# Patient Record
Sex: Female | Born: 1945 | Race: White | Hispanic: No | Marital: Married | Smoking: Never smoker
Health system: Southern US, Community
[De-identification: ages and names within clinical notes are randomized; demographics above are authoritative.]

## PROBLEM LIST (undated history)

## (undated) DIAGNOSIS — Q615 Medullary cystic kidney: Secondary | ICD-10-CM

## (undated) DIAGNOSIS — I341 Nonrheumatic mitral (valve) prolapse: Secondary | ICD-10-CM

## (undated) HISTORY — PX: TONSILLECTOMY: SUR1361

## (undated) HISTORY — PX: OTHER SURGICAL HISTORY: SHX169

---

## 2004-07-08 ENCOUNTER — Ambulatory Visit: Payer: Self-pay | Admitting: Gastroenterology

## 2004-07-29 ENCOUNTER — Ambulatory Visit: Payer: Self-pay | Admitting: Gastroenterology

## 2008-02-16 ENCOUNTER — Emergency Department (HOSPITAL_COMMUNITY): Admission: EM | Admit: 2008-02-16 | Discharge: 2008-02-16 | Payer: Self-pay | Admitting: Emergency Medicine

## 2010-05-31 LAB — URINALYSIS, ROUTINE W REFLEX MICROSCOPIC
Bilirubin Urine: NEGATIVE
Ketones, ur: 15 mg/dL — AB
Specific Gravity, Urine: 1.03 (ref 1.005–1.030)
pH: 6.5 (ref 5.0–8.0)

## 2010-05-31 LAB — BASIC METABOLIC PANEL
Calcium: 8.9 mg/dL (ref 8.4–10.5)
Creatinine, Ser: 0.68 mg/dL (ref 0.4–1.2)
GFR calc Af Amer: >60 mL/min (ref >60)
GFR calc non Af Amer: >60 mL/min (ref >60)
Sodium: 139 mEq/L (ref 135–145)

## 2010-05-31 LAB — URINE MICROSCOPIC-ADD ON

## 2010-05-31 LAB — POCT I-STAT, CHEM 8
Calcium, Ion: 1.12 mmol/L (ref 1.12–1.32)
Chloride: 103 mEq/L (ref 96–112)
HCT: 43 % (ref 36.0–46.0)
Potassium: 4 mEq/L (ref 3.5–5.1)
Sodium: 140 mEq/L (ref 135–145)

## 2010-05-31 LAB — HEPATIC FUNCTION PANEL
Albumin: 3.6 g/dL (ref 3.5–5.2)
Total Protein: 7 g/dL (ref 6.0–8.3)

## 2010-05-31 LAB — MAGNESIUM: Magnesium: 2.2 mg/dL (ref 1.5–2.5)

## 2010-05-31 LAB — LIPASE, BLOOD: Lipase: 25 U/L (ref 11–59)

## 2011-05-06 ENCOUNTER — Other Ambulatory Visit: Payer: Self-pay | Admitting: Family Medicine

## 2011-05-14 ENCOUNTER — Other Ambulatory Visit: Payer: Self-pay | Admitting: Family Medicine

## 2011-06-08 ENCOUNTER — Other Ambulatory Visit: Payer: Self-pay | Admitting: Family Medicine

## 2011-07-19 ENCOUNTER — Ambulatory Visit (INDEPENDENT_AMBULATORY_CARE_PROVIDER_SITE_OTHER): Payer: BC Managed Care – PPO | Admitting: Family Medicine

## 2011-07-19 ENCOUNTER — Other Ambulatory Visit: Payer: Self-pay | Admitting: Family Medicine

## 2011-07-19 VITALS — BP 131/79 | HR 71 | Temp 98.4°F | Resp 18 | Ht 62.5 in | Wt 139.0 lb

## 2011-07-19 DIAGNOSIS — E039 Hypothyroidism, unspecified: Secondary | ICD-10-CM

## 2011-07-19 DIAGNOSIS — K219 Gastro-esophageal reflux disease without esophagitis: Secondary | ICD-10-CM

## 2011-07-19 DIAGNOSIS — Z780 Asymptomatic menopausal state: Secondary | ICD-10-CM

## 2011-07-19 MED ORDER — CONJ ESTROG-MEDROXYPROGEST ACE 0.3-1.5 MG PO TABS: 1.0000 | ORAL_TABLET | Freq: Every day | ORAL | Status: DC

## 2011-07-19 MED ORDER — LEVOTHYROXINE SODIUM 75 MCG PO TABS: 75.0000 ug | ORAL_TABLET | Freq: Every day | ORAL | Status: DC

## 2011-07-19 MED ORDER — OMEPRAZOLE 20 MG PO CPDR: 20.0000 mg | DELAYED_RELEASE_CAPSULE | Freq: Every day | ORAL | Status: DC

## 2011-07-19 NOTE — Progress Notes (Signed)
  Subjective:    Patient ID: Karen Valenzuela, female    DOB: October 03, 1945, 66 y.o.   MRN: [id number]  HPI  Patient presents for routine follow up.  Here for medication refills Patient states her endocrinologist retired(Dr. Dagoberto Ligas) however told her to remain on Synthroid and Prempro.  States last pap normal 2011 Dr. Seymour Bars Mammogram many years ago  Review of Systems  Constitutional: Positive for fatigue.  Genitourinary:       Reduced libido       Objective:   Physical Exam  Constitutional: She appears well-developed.  Neck: Neck supple. No thyromegaly present.  Cardiovascular: Normal rate, regular rhythm and normal heart sounds.   Pulmonary/Chest: Effort normal and breath sounds normal.  Abdominal: Soft. Bowel sounds are normal.       No HSM  Neurological: She is alert.  Skin: Skin is warm.       Thyroidectomy scar       Assessment & Plan:   1. Hypothyroid   2. GERD (gastroesophageal reflux disease)   3. Postmenopausal     TSH Refill Synthroid X year Prempro X 3 months only; after considerable discussion patient agrees to mammogram for early detection of breast cancer  along with the added risk associated with hormonal replacement therapy.  If mammogram normal OK to refill Prempro for  remainder of year.  Discussed testosterone cream to assist with libido however patient declined Patient wanted to have a CPE as part of her urgent care visit and I encouraged her to schedule her CPE at 104.  She agreed to call back for appointment.

## 2011-07-20 LAB — TSH: TSH: 0.019 u[IU]/mL — ABNORMAL LOW (ref 0.350–4.500)

## 2011-07-21 LAB — T4, FREE: Free T4: 1.51 ng/dL (ref 0.80–1.80)

## 2011-07-21 LAB — T3, FREE: T3, Free: 2.9 pg/mL (ref 2.3–4.2)

## 2011-10-04 ENCOUNTER — Encounter: Payer: Self-pay | Admitting: Physician Assistant

## 2011-10-04 DIAGNOSIS — Z9289 Personal history of other medical treatment: Secondary | ICD-10-CM | POA: Insufficient documentation

## 2011-10-31 ENCOUNTER — Other Ambulatory Visit: Payer: Self-pay | Admitting: Family Medicine

## 2011-10-31 NOTE — Telephone Encounter (Signed)
See Mammo note-- ok to refill x 9 months??

## 2012-04-18 ENCOUNTER — Other Ambulatory Visit: Payer: Self-pay | Admitting: Radiology

## 2012-07-13 ENCOUNTER — Encounter: Payer: Self-pay | Admitting: Gastroenterology

## 2013-10-23 ENCOUNTER — Encounter (HOSPITAL_COMMUNITY): Payer: Self-pay

## 2013-10-23 ENCOUNTER — Other Ambulatory Visit: Payer: Self-pay | Admitting: Family Medicine

## 2013-10-23 ENCOUNTER — Ambulatory Visit (INDEPENDENT_AMBULATORY_CARE_PROVIDER_SITE_OTHER): Payer: Medicare Other | Admitting: Family Medicine

## 2013-10-23 ENCOUNTER — Ambulatory Visit (HOSPITAL_COMMUNITY)
Admission: RE | Admit: 2013-10-23 | Discharge: 2013-10-23 | Disposition: A | Discharge status: Home / Self Care | Payer: Medicare Other | Source: Ambulatory Visit | Attending: Family Medicine | Admitting: Family Medicine

## 2013-10-23 VITALS — BP 132/90 | HR 86 | Temp 97.9°F | Resp 16 | Ht 62.25 in | Wt 144.0 lb

## 2013-10-23 DIAGNOSIS — R1032 Left lower quadrant pain: Secondary | ICD-10-CM | POA: Diagnosis present

## 2013-10-23 DIAGNOSIS — K7689 Other specified diseases of liver: Secondary | ICD-10-CM | POA: Diagnosis not present

## 2013-10-23 DIAGNOSIS — R935 Abnormal findings on diagnostic imaging of other abdominal regions, including retroperitoneum: Secondary | ICD-10-CM | POA: Diagnosis not present

## 2013-10-23 DIAGNOSIS — K4090 Unilateral inguinal hernia, without obstruction or gangrene, not specified as recurrent: Secondary | ICD-10-CM | POA: Diagnosis not present

## 2013-10-23 DIAGNOSIS — R109 Unspecified abdominal pain: Secondary | ICD-10-CM

## 2013-10-23 LAB — POCT UA - MICROSCOPIC ONLY
BACTERIA, U MICROSCOPIC: NEGATIVE
CRYSTALS, UR, HPF, POC: NEGATIVE
Casts, Ur, LPF, POC: NEGATIVE
Mucus, UA: NEGATIVE
Yeast, UA: NEGATIVE

## 2013-10-23 LAB — POCT CBC
Granulocyte percent: 60.3 %G (ref 37–80)
HCT, POC: 42.4 % (ref 37.7–47.9)
HEMOGLOBIN: 13.5 g/dL (ref 12.2–16.2)
LYMPH, POC: 2.7 (ref 0.6–3.4)
MCH: 29.8 pg (ref 27–31.2)
MCHC: 32 g/dL (ref 31.8–35.4)
MCV: 93.2 fL (ref 80–97)
MID (CBC): 0.6 (ref 0–0.9)
MPV: 6.9 fL (ref 0–99.8)
PLATELET COUNT, POC: 376 10*3/uL (ref 142–424)
POC GRANULOCYTE: 5 (ref 2–6.9)
POC LYMPH PERCENT: 32.3 %L (ref 10–50)
POC MID %: 7.4 % (ref 0–12)
RBC: 4.55 M/uL (ref 4.04–5.48)
RDW, POC: 14.5 %
WBC: 8.3 10*3/uL (ref 4.6–10.2)

## 2013-10-23 LAB — POCT URINALYSIS DIPSTICK
BILIRUBIN UA: NEGATIVE
GLUCOSE UA: NEGATIVE
Ketones, UA: NEGATIVE
Leukocytes, UA: NEGATIVE
NITRITE UA: NEGATIVE
Protein, UA: NEGATIVE
SPEC GRAV UA: 1.015
UROBILINOGEN UA: 0.2
pH, UA: 5.5

## 2013-10-23 LAB — BUN: BUN: 21 mg/dL (ref 6–23)

## 2013-10-23 LAB — CREATININE, SERUM: Creat: 0.89 mg/dL (ref 0.50–1.10)

## 2013-10-23 LAB — POCT I-STAT CREATININE: Creatinine, Ser: 0.9 mg/dL (ref 0.50–1.10)

## 2013-10-23 MED ORDER — IOHEXOL 300 MG/ML  SOLN
100.0000 mL | Freq: Once | INTRAMUSCULAR | Status: AC | PRN
  Administered 2013-10-23: 100 mL via INTRAVENOUS

## 2013-10-23 MED ORDER — IOHEXOL 300 MG/ML  SOLN
50.0000 mL | Freq: Once | INTRAMUSCULAR | Status: AC | PRN
  Administered 2013-10-23: 50 mL via ORAL

## 2013-10-23 NOTE — Progress Notes (Signed)
[facility] [address] [place] [address] [phone]  Date:  10/23/2013   Name:  Karen Valenzuela   DOB:  1945-06-29   MRN:  [id number]  PCP:  No PCP Per Patient    Chief Complaint: Groin Pain   History of Present Illness:  [name] is a 68 y.o. very pleasant female patient who presents with the following:  She is here today with left side sided groin pain.  She is known to have a left sided hernia- it has been there since her teenage years. Never had surgical repair, it has never bothered her in the past.  However about a week ago she started to have left groin pain.  NKI.   The area is is now painful if she is on her feet, walking, etc.   It hurts to press on the area.  She feels better if she lies down and rests.   She is eating normally and feels well generally, no vomiting or diarrehea.  No fever.   She has not noted any urinary sx  She is otherwise generally healthy.  Here today with her husband Tom  Patient Active Problem List   Diagnosis Date Noted  . H/O mammogram 10/04/2011    History reviewed. No pertinent past medical history.  History reviewed. No pertinent past surgical history.  History  Substance Use Topics  . Smoking status: Never Smoker   . Smokeless tobacco: Not on file  . Alcohol Use: Not on file    History reviewed. No pertinent family history.  No Known Allergies  Medication list has been reviewed and updated.  Current Outpatient Prescriptions on File Prior to Visit  Medication Sig Dispense Refill  . levothyroxine (SYNTHROID) 75 MCG tablet Take 1 tablet (75 mcg total) by mouth daily.  90 tablet  2  . omeprazole (PRILOSEC) 20 MG capsule Take 1 capsule (20 mg total) by mouth daily.  90 capsule  2  . PREMPRO 0.3-1.5 MG per tablet TAKE 1 TABLET BY MOUTH DAILY.  84 each  0   No current facility-administered medications on file prior to visit.    Review of Systems:  As per HPI- otherwise negative.   Physical  Examination: Filed Vitals:   10/23/13 1455  BP: 132/90  Pulse: 86  Temp: 97.9 F (36.6 C)  Resp: 16   Filed Vitals:   10/23/13 1455  Height: 5' 2.25" (1.581 m)  Weight: 144 lb (65.318 kg)   Body mass index is 26.13 kg/(m^2). Ideal Body Weight: Weight in (lb) to have BMI = 25: 137.5  GEN: WDWN, NAD, Non-toxic, A & O x 3, looks well HEENT: Atraumatic, Normocephalic. Neck supple. No masses, No LAD. Ears and Nose: No external deformity. CV: RRR, No M/G/R. No JVD. No thrill. No extra heart sounds. PULM: CTA B, no wheezes, crackles, rhonchi. No retractions. No resp. distress. No accessory muscle use. ABD: S, NT, ND, +BS. No rebound. No HSM. EXTR: No c/c/e NEURO Normal gait.  PSYCH: Normally interactive. Conversant. Not depressed or anxious appearing.  Calm demeanor.  She has a small visible bulge in the left inguinal region.  Not able to reduce by pressing on the area, but it also does not get larger with valsalva.    Results for orders placed in visit on 10/23/13  POCT URINALYSIS DIPSTICK      Result Value Ref Range   Color, UA yellow     Clarity, UA clear  Glucose, UA neg     Bilirubin, UA neg     Ketones, UA neg     Spec Grav, UA 1.015     Blood, UA small     pH, UA 5.5     Protein, UA neg     Urobilinogen, UA 0.2     Nitrite, UA neg     Leukocytes, UA Negative    POCT UA - MICROSCOPIC ONLY      Result Value Ref Range   WBC, Ur, HPF, POC 1-4     RBC, urine, microscopic 0-1     Bacteria, U Microscopic neg     Mucus, UA neg     Epithelial cells, urine per micros 1-2     Crystals, Ur, HPF, POC neg     Casts, Ur, LPF, POC neg     Yeast, UA neg    POCT CBC      Result Value Ref Range   WBC 8.3  4.6 - 10.2 K/uL   Lymph, poc 2.7  0.6 - 3.4   POC LYMPH PERCENT 32.3  10 - 50 %L   MID (cbc) 0.6  0 - 0.9   POC MID % 7.4  0 - 12 %M   POC Granulocyte 5.0  2 - 6.9   Granulocyte percent 60.3  37 - 80 %G   RBC 4.55  4.04 - 5.48 M/uL   Hemoglobin 13.5  12.2 - 16.2 g/dL    HCT, POC 40.9  81.1 - 47.9 %   MCV 93.2  80 - 97 fL   MCH, POC 29.8  27 - 31.2 pg   MCHC 32.0  31.8 - 35.4 g/dL   RDW, POC 91.4     Platelet Count, POC 376  142 - 424 K/uL   MPV 6.9  0 - 99.8 fL    Assessment and Plan: Left groin pain - Plan: CT Abdomen Pelvis W Contrast, POCT urinalysis dipstick, POCT UA - Microscopic Only, POCT CBC, Urine culture, CANCELED: CT Abdomen Pelvis W Contrast  Inguinal hernia, symptotic for one week.  Will refer for a CT today to determine the extent of her hernia and rule- out bowel strangulation.    Signed Abbe Amsterdam, MD  CT ABDOMEN AND PELVIS WITH CONTRAST  TECHNIQUE: Multidetector CT imaging of the abdomen and pelvis was performed using the standard protocol following bolus administration of intravenous contrast.  CONTRAST: 50mL OMNIPAQUE IOHEXOL 300 MG/ML SOLN, OMNIPAQUE IOHEXOL 300 MG/ML SOLN  COMPARISON: None.  FINDINGS: The lung bases are clear. No pleural effusion.  Diffuse fatty infiltration of the liver but no focal hepatic lesions or intrahepatic biliary dilatation. The gallbladder is normal. No common bowel duct dilatation. The pancreas is unremarkable. The spleen is normal in size. No focal lesions. The adrenal glands and kidneys are unremarkable.  The stomach, duodenum, small bowel and colon are unremarkable. No inflammatory changes, mass lesions or obstructive findings. The appendix is normal. No mesenteric or retroperitoneal mass or adenopathy. Moderate tortuosity of the abdominal aorta. The major branch vessels are patent. The major venous structures are patent.  There is a moderate to large left inguinal hernia which does not contain bowel. There are infiltrative changes in the fat however which could suggest incarceration. There is also a small amount of fluid in the left inguinal canal area.  The uterus and ovaries are unremarkable. The bladder is normal. No pelvic mass or adenopathy. No free pelvic  fluid collections.  The bony structures are unremarkable.  IMPRESSION: Moderate-sized left inguinal hernia with infiltrative changes involving the fat suggesting incarceration. There is no bowel in the hernia.  No other significant abdominal/ pelvic findings other than diffuse fatty infiltration of the liver.  Discussed with general surgeon on call MD and with pt.  She will need to see general surgery soon but not emergently.  Surgical repair may be necessary. She understands this and will seek care if she has any worsening or change in her condition.  She does not desire anything for pain at this time

## 2013-10-23 NOTE — Patient Instructions (Signed)
We will send you for a CT today to evaluate your hernia.  I will call you later today with these results.  If you DO have strangulation you will see a surgeon right away.  Otherwise let's plan to have you see general surgery in the next 1-2 weeks to discuss your options. If you start to get worse or have other concerns in the meantime please give me a call or come back in to see [place].     Please proceed to [facility] (radiology) for your CT scan. They will have you drink contrast and check your kidney function prior to your scan

## 2013-10-24 LAB — URINE CULTURE
COLONY COUNT: NO GROWTH
Organism ID, Bacteria: NO GROWTH

## 2013-10-25 ENCOUNTER — Encounter: Payer: Self-pay | Admitting: Family Medicine

## 2013-10-25 ENCOUNTER — Telehealth: Payer: Self-pay

## 2013-10-25 NOTE — Telephone Encounter (Signed)
Called pt per Dr. Cyndie Chime request to see if pt wanted to be seen by a surgeon as soon as today or Monday due to her still having abdominal pain. Pt wanted to see who Dr. Patsy Lager suggested before she was able to answer the question.

## 2013-10-25 NOTE — Telephone Encounter (Signed)
Called [facility] and was able to get her an appt for next week.  They planned to call her and give details.  I will check in with her also

## 2013-11-13 ENCOUNTER — Other Ambulatory Visit (INDEPENDENT_AMBULATORY_CARE_PROVIDER_SITE_OTHER): Payer: Self-pay | Admitting: Surgery

## 2013-11-14 ENCOUNTER — Encounter (HOSPITAL_BASED_OUTPATIENT_CLINIC_OR_DEPARTMENT_OTHER): Payer: Self-pay | Admitting: *Deleted

## 2013-11-15 ENCOUNTER — Ambulatory Visit (HOSPITAL_BASED_OUTPATIENT_CLINIC_OR_DEPARTMENT_OTHER): Payer: Medicare Other | Admitting: Certified Registered"

## 2013-11-15 ENCOUNTER — Ambulatory Visit (HOSPITAL_BASED_OUTPATIENT_CLINIC_OR_DEPARTMENT_OTHER)
Admission: RE | Admit: 2013-11-15 | Discharge: 2013-11-15 | Disposition: A | Discharge status: Home / Self Care | Payer: Medicare Other | Source: Ambulatory Visit | Attending: Surgery | Admitting: Surgery

## 2013-11-15 ENCOUNTER — Encounter (HOSPITAL_BASED_OUTPATIENT_CLINIC_OR_DEPARTMENT_OTHER): Payer: Self-pay | Admitting: Certified Registered"

## 2013-11-15 ENCOUNTER — Encounter (HOSPITAL_BASED_OUTPATIENT_CLINIC_OR_DEPARTMENT_OTHER)
Admission: RE | Disposition: A | Discharge status: Home / Self Care | Payer: Self-pay | Source: Ambulatory Visit | Attending: Surgery

## 2013-11-15 ENCOUNTER — Encounter (HOSPITAL_BASED_OUTPATIENT_CLINIC_OR_DEPARTMENT_OTHER): Payer: Medicare Other | Admitting: Certified Registered"

## 2013-11-15 DIAGNOSIS — K4030 Unilateral inguinal hernia, with obstruction, without gangrene, not specified as recurrent: Secondary | ICD-10-CM | POA: Diagnosis present

## 2013-11-15 HISTORY — DX: Medullary cystic kidney: Q61.5

## 2013-11-15 HISTORY — PX: INSERTION OF MESH: SHX5868

## 2013-11-15 HISTORY — DX: Nonrheumatic mitral (valve) prolapse: I34.1

## 2013-11-15 HISTORY — PX: INGUINAL HERNIA REPAIR: SHX194

## 2013-11-15 SURGERY — REPAIR, HERNIA, INGUINAL, ADULT
Anesthesia: Regional | Site: Groin | Laterality: Left

## 2013-11-15 MED ORDER — LACTATED RINGERS IV SOLN
INTRAVENOUS | Status: DC
  Administered 2013-11-15 (×2): via INTRAVENOUS

## 2013-11-15 MED ORDER — LIDOCAINE HCL (CARDIAC) 20 MG/ML IV SOLN
INTRAVENOUS | Status: DC | PRN
  Administered 2013-11-15: 30 mg via INTRAVENOUS

## 2013-11-15 MED ORDER — ONDANSETRON HCL 4 MG/2ML IJ SOLN
INTRAMUSCULAR | Status: DC | PRN
  Administered 2013-11-15: 4 mg via INTRAVENOUS

## 2013-11-15 MED ORDER — PROPOFOL 10 MG/ML IV BOLUS
INTRAVENOUS | Status: DC | PRN
  Administered 2013-11-15: 100 mg via INTRAVENOUS

## 2013-11-15 MED ORDER — FENTANYL CITRATE 0.05 MG/ML IJ SOLN
INTRAMUSCULAR | Status: AC
  Filled 2013-11-15: qty 6

## 2013-11-15 MED ORDER — BUPIVACAINE HCL (PF) 0.5 % IJ SOLN
INTRAMUSCULAR | Status: DC | PRN
  Administered 2013-11-15: 20 mL

## 2013-11-15 MED ORDER — FENTANYL CITRATE 0.05 MG/ML IJ SOLN
INTRAMUSCULAR | Status: DC | PRN
  Administered 2013-11-15 (×2): 25 ug via INTRAVENOUS

## 2013-11-15 MED ORDER — PROPOFOL INFUSION 10 MG/ML OPTIME
INTRAVENOUS | Status: DC | PRN
  Administered 2013-11-15: 100 ug/kg/min via INTRAVENOUS

## 2013-11-15 MED ORDER — ROCURONIUM BROMIDE 50 MG/5ML IV SOLN
INTRAVENOUS | Status: AC
  Filled 2013-11-15: qty 1

## 2013-11-15 MED ORDER — OXYCODONE HCL 5 MG/5ML PO SOLN: 5.0000 mg | Freq: Once | ORAL | Status: DC | PRN

## 2013-11-15 MED ORDER — HYDROCODONE-ACETAMINOPHEN 5-325 MG PO TABS: 1.0000 | ORAL_TABLET | ORAL | Status: AC | PRN

## 2013-11-15 MED ORDER — MIDAZOLAM HCL 2 MG/2ML IJ SOLN
INTRAMUSCULAR | Status: AC
  Filled 2013-11-15: qty 2

## 2013-11-15 MED ORDER — FENTANYL CITRATE 0.05 MG/ML IJ SOLN
50.0000 ug | INTRAMUSCULAR | Status: DC | PRN
  Administered 2013-11-15: 100 ug via INTRAVENOUS

## 2013-11-15 MED ORDER — EPHEDRINE SULFATE 50 MG/ML IJ SOLN
INTRAMUSCULAR | Status: DC | PRN
  Administered 2013-11-15: 10 mg via INTRAVENOUS

## 2013-11-15 MED ORDER — ROPIVACAINE HCL 5 MG/ML IJ SOLN
INTRAMUSCULAR | Status: DC | PRN
  Administered 2013-11-15: 25 mL

## 2013-11-15 MED ORDER — FENTANYL CITRATE 0.05 MG/ML IJ SOLN
INTRAMUSCULAR | Status: AC
  Filled 2013-11-15: qty 2

## 2013-11-15 MED ORDER — MIDAZOLAM HCL 2 MG/2ML IJ SOLN
1.0000 mg | INTRAMUSCULAR | Status: DC | PRN
  Administered 2013-11-15: 1 mg via INTRAVENOUS

## 2013-11-15 MED ORDER — HYDROMORPHONE HCL 1 MG/ML IJ SOLN: 0.2500 mg | INTRAMUSCULAR | Status: DC | PRN

## 2013-11-15 MED ORDER — PROPOFOL 10 MG/ML IV EMUL
INTRAVENOUS | Status: AC
  Filled 2013-11-15: qty 50

## 2013-11-15 MED ORDER — OXYCODONE HCL 5 MG PO TABS: 5.0000 mg | ORAL_TABLET | Freq: Once | ORAL | Status: DC | PRN

## 2013-11-15 MED ORDER — DEXAMETHASONE SODIUM PHOSPHATE 4 MG/ML IJ SOLN
INTRAMUSCULAR | Status: DC | PRN
  Administered 2013-11-15: 10 mg via INTRAVENOUS

## 2013-11-15 MED ORDER — CEFAZOLIN SODIUM-DEXTROSE 2-3 GM-% IV SOLR
INTRAVENOUS | Status: AC
  Filled 2013-11-15: qty 50

## 2013-11-15 MED ORDER — BUPIVACAINE HCL (PF) 0.5 % IJ SOLN
INTRAMUSCULAR | Status: AC
  Filled 2013-11-15: qty 30

## 2013-11-15 MED ORDER — CEFAZOLIN SODIUM-DEXTROSE 2-3 GM-% IV SOLR
2.0000 g | INTRAVENOUS | Status: AC
  Administered 2013-11-15: 2 g via INTRAVENOUS

## 2013-11-15 SURGICAL SUPPLY — 47 items
APL SKNCLS STERI-STRIP NONHPOA (GAUZE/BANDAGES/DRESSINGS) ×2
BENZOIN TINCTURE PRP APPL 2/3 (GAUZE/BANDAGES/DRESSINGS) ×4 IMPLANT
BLADE CLIPPER SURG (BLADE) ×2 IMPLANT
BLADE SURG 15 STRL LF DISP TIS (BLADE) ×2 IMPLANT
BLADE SURG 15 STRL SS (BLADE) ×4
CANISTER SUCT 1200ML W/VALVE (MISCELLANEOUS) IMPLANT
CHLORAPREP W/TINT 26ML (MISCELLANEOUS) ×4 IMPLANT
CLEANER CAUTERY TIP 5X5 PAD (MISCELLANEOUS) ×2 IMPLANT
CLOSURE WOUND 1/2 X4 (GAUZE/BANDAGES/DRESSINGS) ×1
COVER MAYO STAND STRL (DRAPES) ×4 IMPLANT
COVER TABLE BACK 60X90 (DRAPES) ×4 IMPLANT
DECANTER SPIKE VIAL GLASS SM (MISCELLANEOUS) IMPLANT
DRAIN PENROSE 1/2X12 LTX STRL (WOUND CARE) ×4 IMPLANT
DRAPE PED LAPAROTOMY (DRAPES) ×4 IMPLANT
DRAPE UTILITY XL STRL (DRAPES) ×4 IMPLANT
ELECT REM PT RETURN 9FT ADLT (ELECTROSURGICAL) ×4
ELECTRODE REM PT RTRN 9FT ADLT (ELECTROSURGICAL) ×2 IMPLANT
GLOVE BIOGEL PI IND STRL 7.0 (GLOVE) IMPLANT
GLOVE BIOGEL PI INDICATOR 7.0 (GLOVE) ×2
GLOVE ECLIPSE 6.5 STRL STRAW (GLOVE) ×2 IMPLANT
GLOVE SURG ORTHO 8.0 STRL STRW (GLOVE) ×4 IMPLANT
GOWN STRL REUS W/ TWL LRG LVL3 (GOWN DISPOSABLE) ×2 IMPLANT
GOWN STRL REUS W/ TWL XL LVL3 (GOWN DISPOSABLE) ×2 IMPLANT
GOWN STRL REUS W/TWL LRG LVL3 (GOWN DISPOSABLE) ×4
GOWN STRL REUS W/TWL XL LVL3 (GOWN DISPOSABLE) ×4
MESH ULTRAPRO 3X6 7.6X15CM (Mesh General) ×2 IMPLANT
NDL HYPO 25X1 1.5 SAFETY (NEEDLE) ×2 IMPLANT
NEEDLE HYPO 25X1 1.5 SAFETY (NEEDLE) ×4 IMPLANT
NS IRRIG 1000ML POUR BTL (IV SOLUTION) ×4 IMPLANT
PACK BASIN DAY SURGERY FS (CUSTOM PROCEDURE TRAY) ×4 IMPLANT
PAD CLEANER CAUTERY TIP 5X5 (MISCELLANEOUS) ×2
PENCIL BUTTON HOLSTER BLD 10FT (ELECTRODE) ×4 IMPLANT
SLEEVE SCD COMPRESS KNEE MED (MISCELLANEOUS) ×2 IMPLANT
SPONGE GAUZE 4X4 12PLY STER LF (GAUZE/BANDAGES/DRESSINGS) ×2 IMPLANT
STRIP CLOSURE SKIN 1/2X4 (GAUZE/BANDAGES/DRESSINGS) ×3 IMPLANT
SUT MNCRL AB 4-0 PS2 18 (SUTURE) ×4 IMPLANT
SUT NOVA NAB GS-22 2 0 T19 (SUTURE) ×10 IMPLANT
SUT SILK 2 0 SH (SUTURE) ×4 IMPLANT
SUT SILK 2 0 TIES 17X18 (SUTURE)
SUT SILK 2-0 18XBRD TIE BLK (SUTURE) IMPLANT
SUT VICRYL 3-0 CR8 SH (SUTURE) ×4 IMPLANT
SYR CONTROL 10ML LL (SYRINGE) ×4 IMPLANT
TOWEL OR 17X24 6PK STRL BLUE (TOWEL DISPOSABLE) ×8 IMPLANT
TOWEL OR NON WOVEN STRL DISP B (DISPOSABLE) ×4 IMPLANT
TUBE CONNECTING 20'X1/4 (TUBING)
TUBE CONNECTING 20X1/4 (TUBING) IMPLANT
YANKAUER SUCT BULB TIP NO VENT (SUCTIONS) IMPLANT

## 2013-11-15 NOTE — Transfer of Care (Signed)
Immediate Anesthesia Transfer of Care Note  Patient: Karen PastelSusan R Kerth  Procedure(s) Performed: Procedure(s): REPAIR INCARCERATED LEFT INGUINAL HERNIA (Left) INSERTION OF MESH (Left)  Patient Location: PACU  Anesthesia Type:GA combined with regional for post-op pain  Level of Consciousness: awake and patient cooperative  Airway & Oxygen Therapy: Patient Spontanous Breathing and Patient connected to face mask oxygen  Post-op Assessment: Report given to PACU RN and Post -op Vital signs reviewed and stable  Post vital signs: Reviewed and stable  Complications: No apparent anesthesia complications

## 2013-11-15 NOTE — Anesthesia Procedure Notes (Addendum)
Anesthesia Regional Block:  TAP block  Pre-Anesthetic Checklist: ,, timeout performed, Correct Patient, Correct Site, Correct Laterality, Correct Procedure, Correct Position, site marked, Risks and benefits discussed, pre-op evaluation, post-op pain management  Laterality: Left  Prep: Maximum Sterile Barrier Precautions used and chloraprep       Needles:  Injection technique: Single-shot  Needle Type: Echogenic Stimulator Needle     Needle Length: 9cm 9 cm Needle Gauge: 21 and 21 G    Additional Needles:  Procedures: ultrasound guided (picture in chart) TAP block Narrative:  Start time: 11/15/2013 7:15 AM End time: 11/15/2013 7:25 AM Injection made incrementally with aspirations every 5 mL. Anesthesiologist: Fitzgerald,MD  Additional Notes: 2% Lidocaine skin wheel.   Procedure Name: LMA Insertion Date/Time: 11/15/2013 7:40 AM Performed by: Mykai Wendorf Pre-anesthesia Checklist: Patient identified, Emergency Drugs available, Suction available and Patient being monitored Patient Re-evaluated:Patient Re-evaluated prior to inductionOxygen Delivery Method: Circle System Utilized Preoxygenation: Pre-oxygenation with 100% oxygen Intubation Type: IV induction Ventilation: Mask ventilation without difficulty LMA: LMA inserted LMA Size: 4.0 Number of attempts: 1 Airway Equipment and Method: bite block Placement Confirmation: positive ETCO2 Tube secured with: Tape Dental Injury: Teeth and Oropharynx as per pre-operative assessment

## 2013-11-15 NOTE — Progress Notes (Signed)
Assisted Dr. Edmond Fitzgerald with left, ultrasound guided, transabdominal plane block. Side rails up, monitors on throughout procedure. See vital signs in flow sheet. Tolerated Procedure well. °

## 2013-11-15 NOTE — Anesthesia Preprocedure Evaluation (Addendum)
Anesthesia Evaluation  Patient identified by MRN, date of birth, ID band Patient awake    Reviewed: Allergy & Precautions, NPO status   Airway Mallampati: II TM Distance: >3 FB Neck ROM: Full    Dental  (+) Teeth Intact, Dental Advisory Given   Pulmonary  breath sounds clear to auscultation        Cardiovascular Rhythm:Regular Rate:Normal     Neuro/Psych    GI/Hepatic   Endo/Other    Renal/GU Renal disease     Musculoskeletal   Abdominal   Peds  Hematology   Anesthesia Other Findings   Reproductive/Obstetrics                          Anesthesia Physical Anesthesia Plan  ASA: I  Anesthesia Plan: General and Regional   Post-op Pain Management:    Induction: Intravenous  Airway Management Planned: LMA  Additional Equipment:   Intra-op Plan:   Post-operative Plan:   Informed Consent: I have reviewed the patients History and Physical, chart, labs and discussed the procedure including the risks, benefits and alternatives for the proposed anesthesia with the patient or authorized representative who has indicated his/her understanding and acceptance.   Dental advisory given  Plan Discussed with: CRNA  Anesthesia Plan Comments:         Anesthesia Quick Evaluation

## 2013-11-15 NOTE — H&P (Signed)
General Surgery [facility]  Karen Valenzuela 11/13/2013 8:40 AM Location: [facility] Patient #: [id number] DOB: [birth year] Married / Language: English / Race: White Female  History of Present Illness Velora Heckler MD; 11/13/2013 9:34 AM) Patient words: ing. hernia.  The patient is a 68 year old female who presents with an inguinal hernia. Patient is referred from Dr. Shanda Bumps Copland for evaluation of incarcerated left inguinal hernia.  Patient has had a left inguinal hernia dating back to childhood. It has always been small and asymptomatic. Over the past several months it has gradually increased in size and she has begun having significant left groin pain. She was evaluated at urgent care. A CT scan of the abdomen and pelvis was obtained showing a moderate sized left inguinal hernia containing fat with infiltrative changes suggesting incarceration or possible strangulation. Symptoms have improved over the past several days. She now presents for surgical assessment and recommendations for repair.  Patient has no prior history of abdominal surgery. She has had no signs or symptoms of intestinal obstruction.   Other Problems Sinclair Grooms [place], [place]; 11/13/2013 9:01 AM) Inguinal Hernia  Past Surgical History Sinclair Grooms [place], [place]; 11/13/2013 9:01 AM) Thyroid Surgery Tonsillectomy  Diagnostic Studies History Sinclair Grooms [place], [place]; 11/13/2013 9:01 AM) Mammogram 1-3 years ago Pap Smear >5 years ago  Allergies ([place], [place]; 11/13/2013 8:40 AM) No Known Drug Allergies09/30/2015  Medication History ([place], RMA; 11/13/2013 9:06 AM) No Current Medications  Social History Sinclair Grooms [place], [place]; 11/13/2013 9:01 AM) Alcohol use Occasional alcohol use. No caffeine use No drug use Tobacco use Never smoker.  Family History Sinclair Grooms [place], [place]; 11/13/2013 9:01 AM) First Degree Relatives No  pertinent family history  Pregnancy / Birth History Sinclair Grooms [place], [place]; 11/13/2013 9:01 AM) Age at menarche 14 years. Age of menopause 25-60 Gravida 0 Para 0  Review of Systems (Dahionnarah Frank RMA; 11/13/2013 9:01 AM) General Present- Weight Gain. Not Present- Appetite Loss, Chills, Fatigue, Fever, Night Sweats and Weight Loss. Skin Not Present- Change in Wart/Mole, Dryness, Hives, Jaundice, New Lesions, Non-Healing Wounds, Rash and Ulcer. HEENT Present- Wears glasses/contact lenses. Not Present- Earache, Hearing Loss, Hoarseness, Nose Bleed, Oral Ulcers, Ringing in the Ears, Seasonal Allergies, Sinus Pain, Sore Throat, Visual Disturbances and Yellow Eyes. Female Genitourinary Not Present- Frequency, Nocturia, Painful Urination, Pelvic Pain and Urgency. Musculoskeletal Not Present- Back Pain, Joint Pain, Joint Stiffness, Muscle Pain, Muscle Weakness and Swelling of Extremities. Neurological Not Present- Decreased Memory, Fainting, Headaches, Numbness, Seizures, Tingling, Tremor, Trouble walking and Weakness. Psychiatric Not Present- Anxiety, Bipolar, Change in Sleep Pattern, Depression, Fearful and Frequent crying. Endocrine Not Present- Cold Intolerance, Excessive Hunger, Hair Changes, Heat Intolerance, Hot flashes and New Diabetes. Hematology Not Present- Easy Bruising, Excessive bleeding, Gland problems, HIV and Persistent Infections.   Vitals (Dahionnarah Maldonado RMA; 11/13/2013 9:04 AM) 11/13/2013 9:01 AM Weight: 143.2 lb Height: 62in Body Surface Area: 1.69 m Body Mass Index: 26.19 kg/m Temp.: 97.86F(Oral)  Pulse: 82 (Regular)  P.OX: 97% (Room air) BP: 100/64 (Sitting, Right Arm, Standard)    Physical Exam Velora Heckler MD; 11/13/2013 9:36 AM) The physical exam findings are as follows: Note:General - appears comfortable, no distress; not diaphorectic  HEENT - normocephalic; sclerae clear, gaze conjugate; mucous membranes moist, dentition  good; voice normal  Neck - symmetric on extension; no palpable anterior or posterior cervical adenopathy; no palpable masses in the thyroid bed; well-healed surgical incision  Chest - clear bilaterally with rhonchi, rales, or wheeze  Cor - regular  rhythm with normal rate; no significant murmur  Abd - soft without distension; small umbilical hernia which is asymptomatic; obvious bulge in left groin with palpable mass, moderate tenderness, not reducible; bulge augments with Valsalva; no sign of femoral hernia bilaterally; no sign of right inguinal hernia  Ext - non-tender without significant edema or lymphedema  Neuro - grossly intact; no tremor    Assessment & Plan Velora Heckler(Sireen Halk M. Garek Schuneman MD; 11/13/2013 9:38 AM) INGUINAL HERNIA, LEFT (550.90  K40.90) Current Plans  I discussed the above findings at length with the patient and her husband. We reviewed the CT scan report. We reviewed office notes from Dr. Patsy Lageropland.  I have recommended left inguinal hernia repair with mesh as an outpatient surgical procedure. I provided him with written literature to review at home. We discussed the risk and benefits as well as alternative procedures. They understand and wish to proceed with surgery in the near future.  Patient will be scheduled for outpatient surgery at a time convenient for her in the immediate future.  The risks and benefits of the procedure have been discussed at length with the patient. The patient understands the proposed procedure, potential alternative treatments, and the course of recovery to be expected. All of the patient's questions have been answered at this time. The patient wishes to proceed with surgery.   Signed by Velora Hecklerodd M Kenya Shiraishi, MD (11/13/2013 9:39 AM)

## 2013-11-15 NOTE — Discharge Instructions (Signed)

## 2013-11-15 NOTE — Anesthesia Postprocedure Evaluation (Signed)
  Anesthesia Post-op Note  Patient: Karen PastelSusan R Oelke  Procedure(s) Performed: Procedure(s): REPAIR INCARCERATED LEFT INGUINAL HERNIA (Left) INSERTION OF MESH (Left)  Patient Location: PACU  Anesthesia Type:General and block  Level of Consciousness: awake and alert   Airway and Oxygen Therapy: Patient Spontanous Breathing  Post-op Pain: none  Post-op Assessment: Post-op Vital signs reviewed, Patient's Cardiovascular Status Stable and Respiratory Function Stable  Post-op Vital Signs: Reviewed  Filed Vitals:   11/15/13 0910  BP: 128/75  Pulse: 68  Temp:   Resp: 13    Complications: No apparent anesthesia complications

## 2013-11-15 NOTE — Op Note (Signed)
Inguinal Hernia, Open, Procedure Note  Pre-operative Diagnosis:  Incarcerated left inguinal hernia  Post-operative Diagnosis: same  Surgeon:  Velora Hecklerodd M. Kingston Shawgo, MD, FACS  Anesthesia:  General  Preparation:  Chlora-prep  Estimated Blood Loss: Minimal  Complications:  none  Indications: The patient presented with a left, not reducible hernia.    Procedure Details  The patient was evaluated in the holding area. All of the patient's questions were answered and the proposed procedure was confirmed. The site of the procedure was properly marked. The patient was taken to the Operating Room, identified by name, and the procedure verified as inguinal hernia repair.  The patient was placed in the supine position and underwent induction of anesthesia. A "Time Out" was performed per routine. The lower abdomen and groin were prepped and draped in the usual aseptic fashion.  After ascertaining that an adequate level of anesthesia had been obtained, an incision was made in the groin with a #10 blade.  Dissection was carried through the subcutaneous tissues and hemostasis obtained with the electrocautery.  A Gelpi retractor was placed for exposure.  The external oblique fascia was incised in line with it's fibers and extended through the external inguinal ring.  There was a moderately large "mass" in the inguinal canal consistent with incarcerated hernia.  This was dissected out and opened.  It contained incarcerated omentum which was reduced.  Hernia sac was excised and closed with a running 2-0 Novafil.  The floor of the inguinal canal was reconstructed with Ethicon Ultrapro mesh cut to the appropriate dimensions.  It was secured to the pubic tubercle with a 2-0 Novafil suture and along the inguinal ligament with a running 2-0 Novafil suture.  The superior margin of the mesh was secured to the transversalis and internal oblique musculature with interrupted 2-0 Novafil sutures.  Local anesthetic was  infiltrated throughout the field.  External oblique fascia was closed with interrupted 3-0 Vicryl sutures.  Subcutaneous tissues were closed with interrupted 3-0 Vicryl sutures.  Skin was anesthetized with local anesthetic, and the skin edges were re-approximated with a running 4-0 Monocryl suture.  Wound was washed and dried and benzoin and steristrips were applied.  A gauze dressing was applied.  Instrument, sponge, and needle counts were correct prior to closure and at the conclusion of the case.  The patient tolerated the procedure well.  The patient was awakened from anesthesia and brought to the recovery room in stable condition.  Velora Hecklerodd M. Shaul Trautman, MD, [facility] Site[facility]ACS [facility] Office: [phone]

## 2013-11-18 ENCOUNTER — Encounter (HOSPITAL_BASED_OUTPATIENT_CLINIC_OR_DEPARTMENT_OTHER): Payer: Self-pay | Admitting: Surgery

## 2014-06-20 ENCOUNTER — Encounter: Payer: Self-pay | Admitting: Gastroenterology

## 2015-05-19 IMAGING — CT CT ABD-PELV W/ CM
2 of 5 series · 17 of 46 positions shown, 19 images · IV contrast (OMNIPAQUE)
Comparison: None.

CLINICAL DATA: Left lower quadrant pain.  Known inguinal hernia.

EXAM:
CT ABDOMEN AND PELVIS WITH CONTRAST
TECHNIQUE: Multidetector CT imaging of the abdomen and pelvis was performed
using the standard protocol following bolus administration of
intravenous contrast.
CONTRAST:  50mL OMNIPAQUE IOHEXOL 300 MG/ML SOLN, 100mL OMNIPAQUE
IOHEXOL 300 MG/ML SOLN

[Series 2: rtn a/p with · axial · 0.74mm/px · z∈[-462,-46]mm · 14 of 93 slices shown, 16 images]
[im 5/93  soft-tissue]
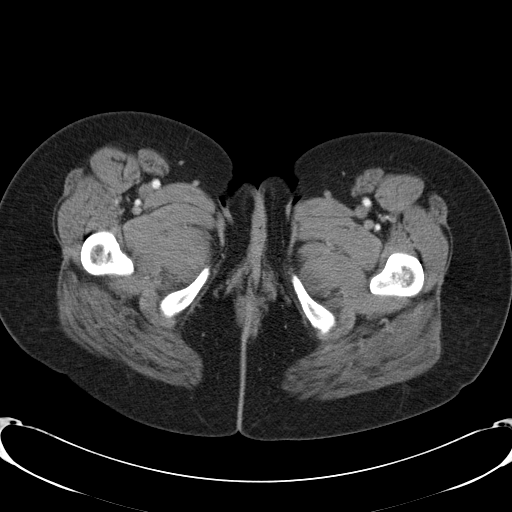
[im 5/93  bone]
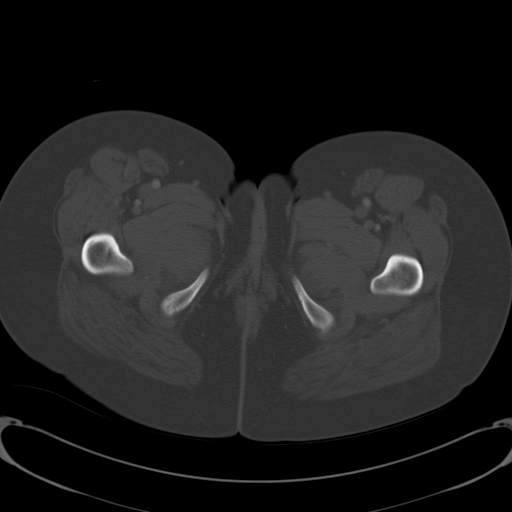
[im 10/93  soft-tissue]
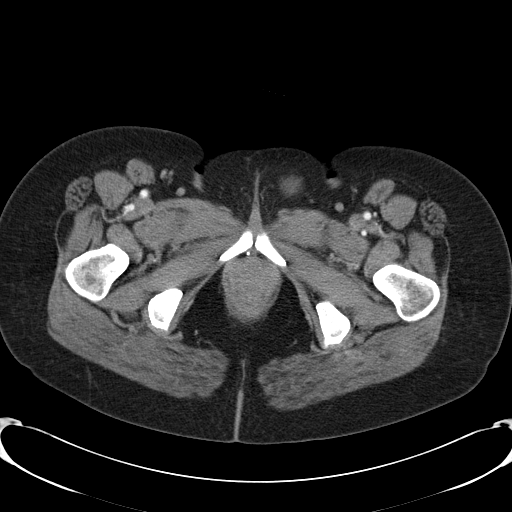
[im 20/93  soft-tissue]
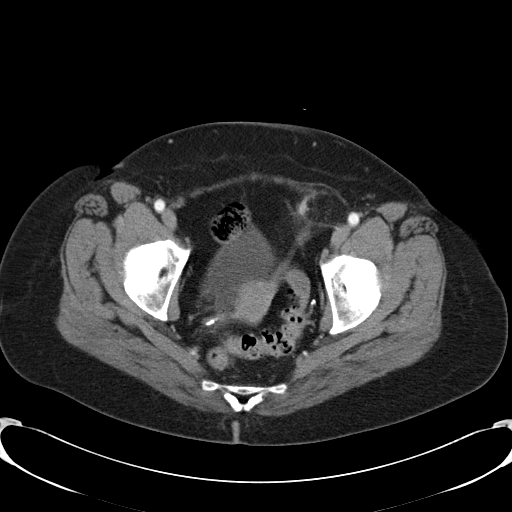
[im 25/93  soft-tissue]
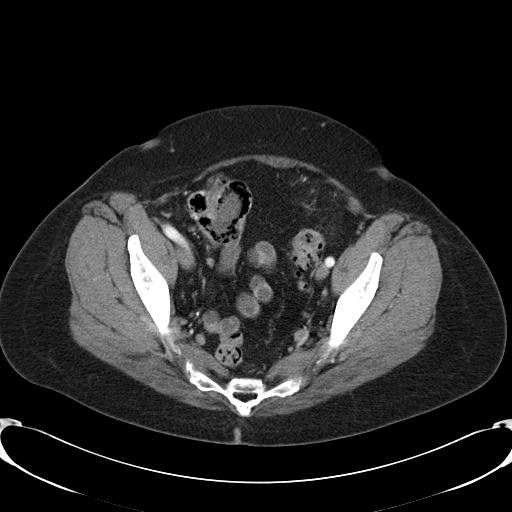
[im 30/93  soft-tissue]
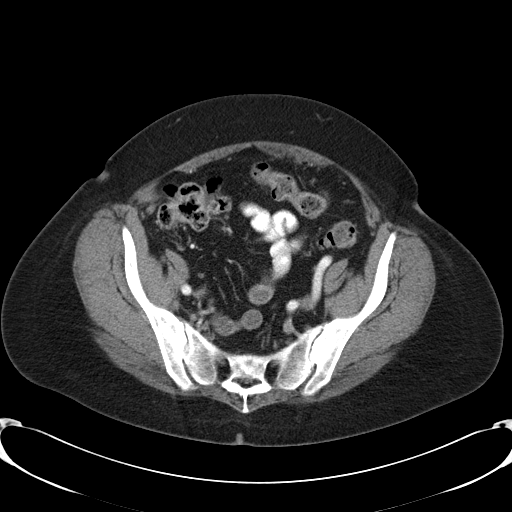
[im 39/93  soft-tissue]
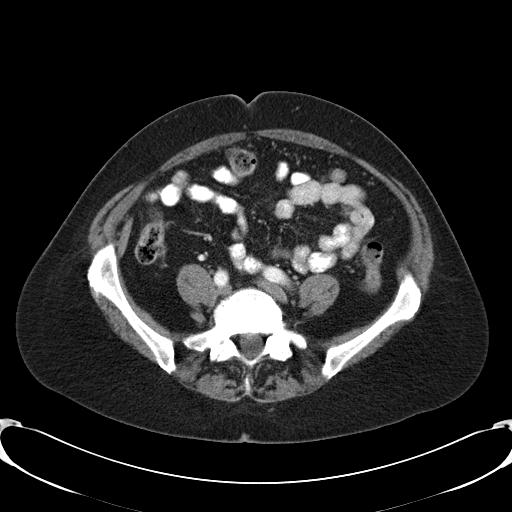
[im 44/93  soft-tissue]
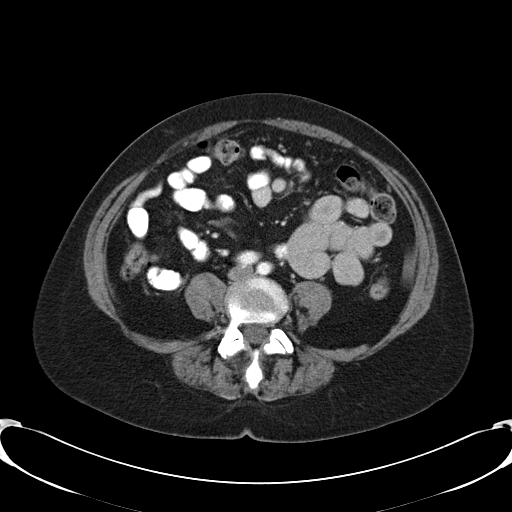
[im 49/93  soft-tissue]
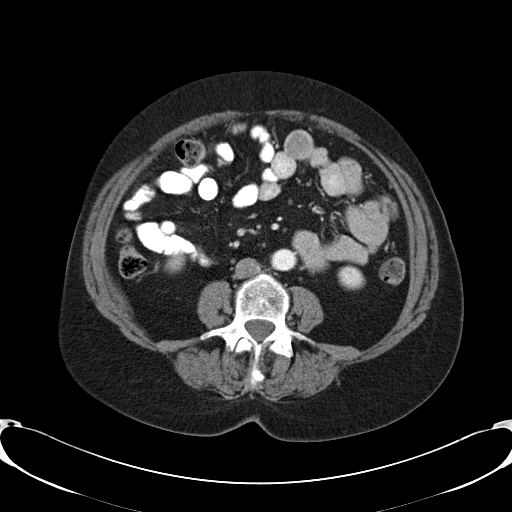
[im 54/93  soft-tissue]
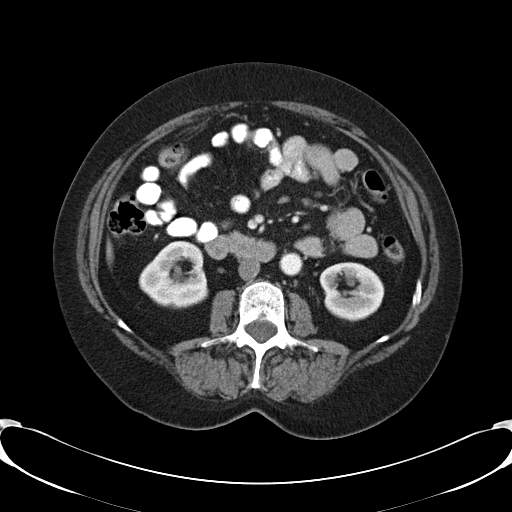
[im 54/93  bone]
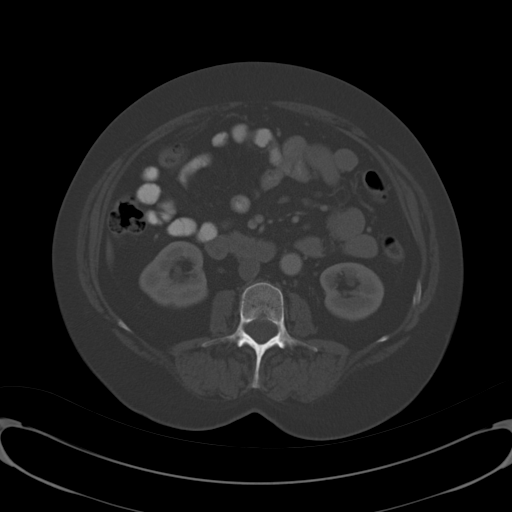
[im 63/93  soft-tissue]
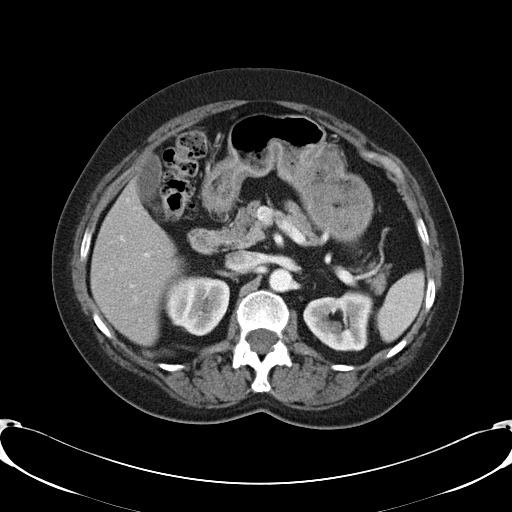
[im 68/93  soft-tissue]
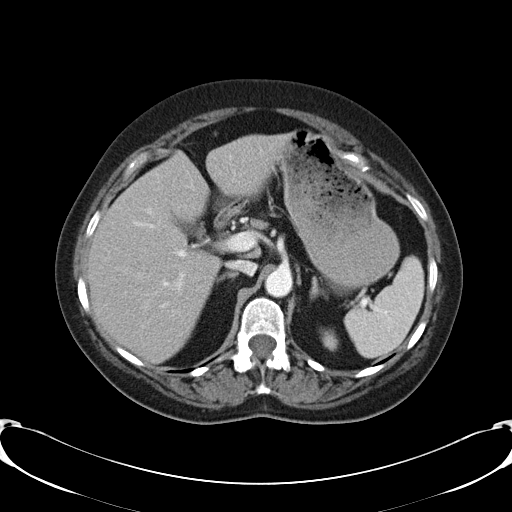
[im 73/93  soft-tissue]
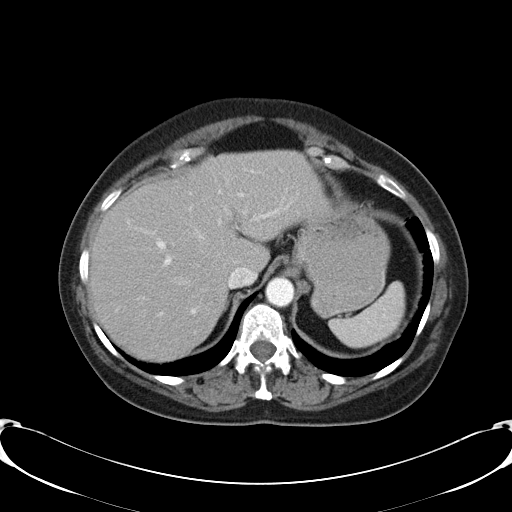
[im 83/93  soft-tissue]
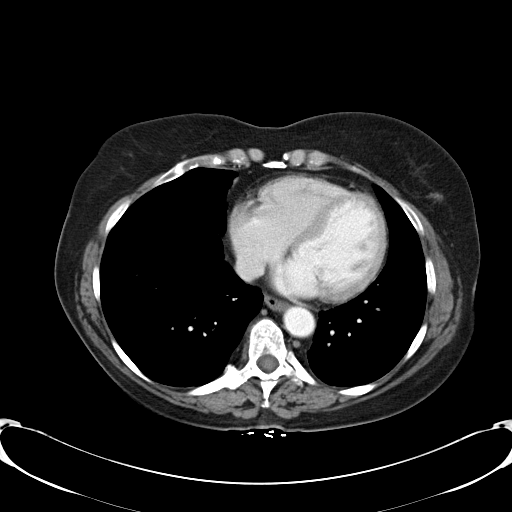
[im 88/93  soft-tissue]
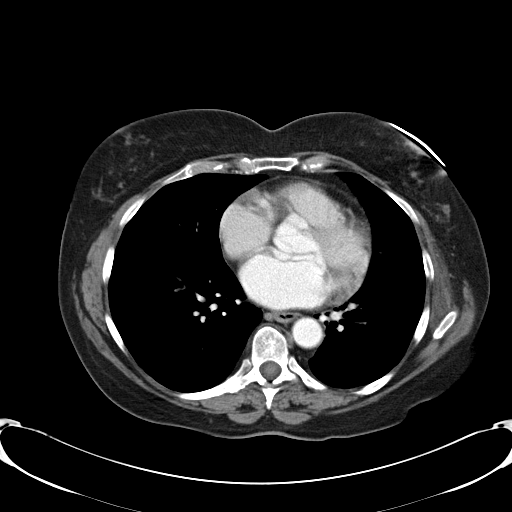

[Series 602: <mpr thick range> · coronal · 0.90mm/px · 3 of 129 slices shown]
[im 43/129  soft-tissue]
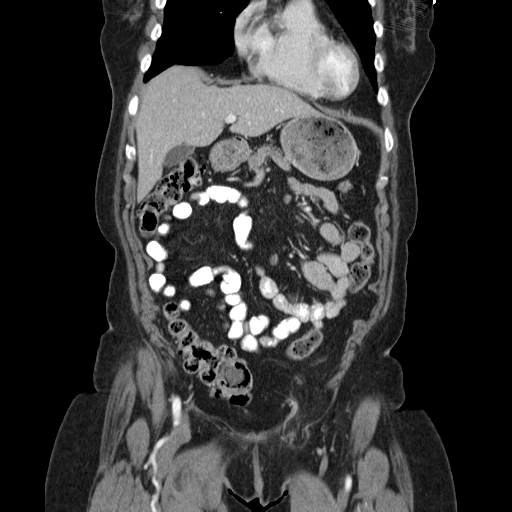
[im 57/129  soft-tissue]
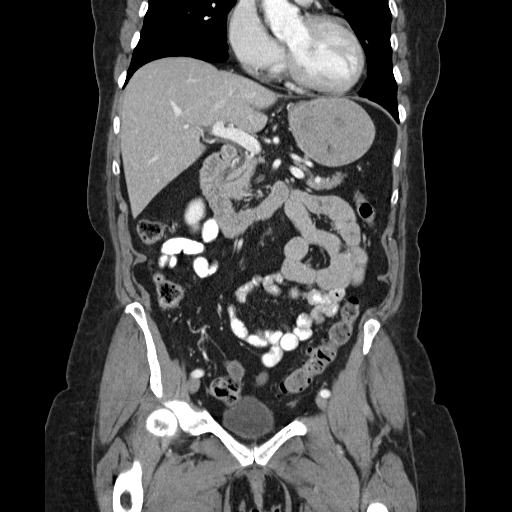
[im 72/129  soft-tissue]
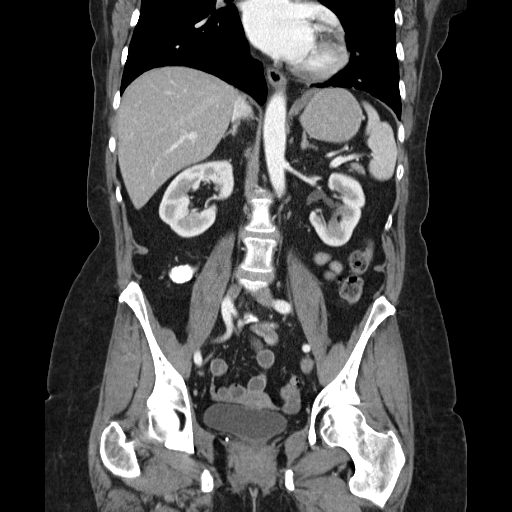

[17 of 46 positions shown; findings below may reference images not displayed]

FINDINGS: The lung bases are clear.  No pleural effusion.

Diffuse fatty infiltration of the liver but no focal hepatic lesions
or intrahepatic biliary dilatation. The gallbladder is normal. No
common bowel duct dilatation. The pancreas is unremarkable. The
spleen is normal in size. No focal lesions. The adrenal glands and
kidneys are unremarkable.

The stomach, duodenum, small bowel and colon are unremarkable. No
inflammatory changes, mass lesions or obstructive findings. The
appendix is normal. No mesenteric or retroperitoneal mass or
adenopathy. Moderate tortuosity of the abdominal aorta. The major
branch vessels are patent. The major venous structures are patent.

There is a moderate to large left inguinal hernia which does not
contain bowel. There are infiltrative changes in the fat however
which could suggest incarceration. There is also a small amount of
fluid in the left inguinal canal area.

The uterus and ovaries are unremarkable. The bladder is normal. No
pelvic mass or adenopathy. No free pelvic fluid collections.

The bony structures are unremarkable.
IMPRESSION: Moderate-sized left inguinal hernia with infiltrative changes
involving the fat suggesting incarceration. There is no bowel in the
hernia.

No other significant abdominal/ pelvic findings other than diffuse
fatty infiltration of the liver.
